# Patient Record
Sex: Female | Born: 1955 | Race: White | Hispanic: No | Marital: Married | Smoking: Never smoker
Health system: Southern US, Community
[De-identification: ages and names within clinical notes are randomized; demographics above are authoritative.]

## PROBLEM LIST (undated history)

## (undated) DIAGNOSIS — K219 Gastro-esophageal reflux disease without esophagitis: Secondary | ICD-10-CM

## (undated) DIAGNOSIS — M5030 Other cervical disc degeneration, unspecified cervical region: Secondary | ICD-10-CM

---

## 2001-11-15 ENCOUNTER — Ambulatory Visit (HOSPITAL_COMMUNITY): Admission: RE | Admit: 2001-11-15 | Discharge: 2001-11-15 | Payer: Self-pay | Admitting: Internal Medicine

## 2001-11-15 ENCOUNTER — Encounter: Payer: Self-pay | Admitting: Internal Medicine

## 2002-12-10 ENCOUNTER — Ambulatory Visit (HOSPITAL_COMMUNITY): Admission: RE | Admit: 2002-12-10 | Discharge: 2002-12-10 | Payer: Self-pay | Admitting: Internal Medicine

## 2002-12-10 ENCOUNTER — Encounter: Payer: Self-pay | Admitting: Internal Medicine

## 2003-02-28 ENCOUNTER — Other Ambulatory Visit: Admission: RE | Admit: 2003-02-28 | Discharge: 2003-02-28 | Payer: Self-pay | Admitting: Internal Medicine

## 2003-12-15 ENCOUNTER — Ambulatory Visit (HOSPITAL_COMMUNITY): Admission: RE | Admit: 2003-12-15 | Discharge: 2003-12-15 | Payer: Self-pay | Admitting: Family Medicine

## 2004-05-26 ENCOUNTER — Other Ambulatory Visit: Admission: RE | Admit: 2004-05-26 | Discharge: 2004-05-26 | Payer: Self-pay | Admitting: Family Medicine

## 2005-01-10 ENCOUNTER — Ambulatory Visit (HOSPITAL_COMMUNITY): Admission: RE | Admit: 2005-01-10 | Discharge: 2005-01-10 | Payer: Self-pay | Admitting: Internal Medicine

## 2005-07-22 ENCOUNTER — Other Ambulatory Visit: Admission: RE | Admit: 2005-07-22 | Discharge: 2005-07-22 | Payer: Self-pay | Admitting: Internal Medicine

## 2006-01-11 ENCOUNTER — Ambulatory Visit (HOSPITAL_COMMUNITY): Admission: RE | Admit: 2006-01-11 | Discharge: 2006-01-11 | Payer: Self-pay | Admitting: Internal Medicine

## 2006-01-27 ENCOUNTER — Encounter: Admission: RE | Admit: 2006-01-27 | Discharge: 2006-01-27 | Payer: Self-pay | Admitting: Internal Medicine

## 2006-02-13 ENCOUNTER — Encounter: Admission: RE | Admit: 2006-02-13 | Discharge: 2006-02-13 | Payer: Self-pay | Admitting: Internal Medicine

## 2006-07-14 ENCOUNTER — Encounter: Admission: RE | Admit: 2006-07-14 | Discharge: 2006-07-14 | Payer: Self-pay | Admitting: Family Medicine

## 2006-10-13 ENCOUNTER — Other Ambulatory Visit: Admission: RE | Admit: 2006-10-13 | Discharge: 2006-10-13 | Payer: Self-pay | Admitting: Family Medicine

## 2007-03-21 ENCOUNTER — Encounter: Admission: RE | Admit: 2007-03-21 | Discharge: 2007-03-21 | Payer: Self-pay | Admitting: Family Medicine

## 2007-12-06 HISTORY — PX: GALLBLADDER SURGERY: SHX652

## 2008-02-15 ENCOUNTER — Other Ambulatory Visit: Admission: RE | Admit: 2008-02-15 | Discharge: 2008-02-15 | Payer: Self-pay | Admitting: Family Medicine

## 2008-03-21 ENCOUNTER — Ambulatory Visit (HOSPITAL_COMMUNITY): Admission: RE | Admit: 2008-03-21 | Discharge: 2008-03-21 | Payer: Self-pay | Admitting: Family Medicine

## 2009-02-18 ENCOUNTER — Other Ambulatory Visit: Admission: RE | Admit: 2009-02-18 | Discharge: 2009-02-18 | Payer: Self-pay | Admitting: Family Medicine

## 2009-03-26 ENCOUNTER — Ambulatory Visit (HOSPITAL_COMMUNITY): Admission: RE | Admit: 2009-03-26 | Discharge: 2009-03-26 | Payer: Self-pay | Admitting: Family Medicine

## 2010-03-29 ENCOUNTER — Ambulatory Visit (HOSPITAL_COMMUNITY): Admission: RE | Admit: 2010-03-29 | Discharge: 2010-03-29 | Payer: Self-pay | Admitting: Family Medicine

## 2010-12-26 ENCOUNTER — Encounter: Payer: Self-pay | Admitting: Internal Medicine

## 2011-03-11 ENCOUNTER — Other Ambulatory Visit (HOSPITAL_COMMUNITY): Payer: Self-pay | Admitting: Family Medicine

## 2011-03-11 DIAGNOSIS — Z1231 Encounter for screening mammogram for malignant neoplasm of breast: Secondary | ICD-10-CM

## 2011-03-25 ENCOUNTER — Other Ambulatory Visit: Payer: Self-pay | Admitting: Family Medicine

## 2011-03-25 DIAGNOSIS — G453 Amaurosis fugax: Secondary | ICD-10-CM

## 2011-03-31 ENCOUNTER — Ambulatory Visit (HOSPITAL_COMMUNITY)
Admission: RE | Admit: 2011-03-31 | Discharge: 2011-03-31 | Disposition: A | Discharge status: Home / Self Care | Payer: 59 | Source: Ambulatory Visit | Attending: Family Medicine | Admitting: Family Medicine

## 2011-03-31 DIAGNOSIS — Z1231 Encounter for screening mammogram for malignant neoplasm of breast: Secondary | ICD-10-CM | POA: Insufficient documentation

## 2011-04-01 ENCOUNTER — Ambulatory Visit
Admission: RE | Admit: 2011-04-01 | Discharge: 2011-04-01 | Disposition: A | Discharge status: Home / Self Care | Payer: 59 | Source: Ambulatory Visit | Attending: Family Medicine | Admitting: Family Medicine

## 2011-04-01 DIAGNOSIS — G453 Amaurosis fugax: Secondary | ICD-10-CM

## 2012-04-04 ENCOUNTER — Other Ambulatory Visit (HOSPITAL_COMMUNITY): Payer: Self-pay | Admitting: Family Medicine

## 2012-04-04 DIAGNOSIS — Z1231 Encounter for screening mammogram for malignant neoplasm of breast: Secondary | ICD-10-CM

## 2012-04-05 ENCOUNTER — Ambulatory Visit (HOSPITAL_COMMUNITY)
Admission: RE | Admit: 2012-04-05 | Discharge: 2012-04-05 | Disposition: A | Discharge status: Home / Self Care | Payer: 59 | Source: Ambulatory Visit | Attending: Family Medicine | Admitting: Family Medicine

## 2012-04-05 DIAGNOSIS — Z1231 Encounter for screening mammogram for malignant neoplasm of breast: Secondary | ICD-10-CM | POA: Insufficient documentation

## 2012-05-23 ENCOUNTER — Other Ambulatory Visit (HOSPITAL_COMMUNITY)
Admission: RE | Admit: 2012-05-23 | Discharge: 2012-05-23 | Disposition: A | Discharge status: Home / Self Care | Payer: 59 | Source: Ambulatory Visit | Attending: Family Medicine | Admitting: Family Medicine

## 2012-05-23 ENCOUNTER — Other Ambulatory Visit: Payer: Self-pay | Admitting: Family Medicine

## 2012-05-23 DIAGNOSIS — Z124 Encounter for screening for malignant neoplasm of cervix: Secondary | ICD-10-CM | POA: Insufficient documentation

## 2013-03-07 ENCOUNTER — Other Ambulatory Visit (HOSPITAL_COMMUNITY): Payer: Self-pay | Admitting: Family Medicine

## 2013-03-07 DIAGNOSIS — Z1231 Encounter for screening mammogram for malignant neoplasm of breast: Secondary | ICD-10-CM

## 2013-04-22 ENCOUNTER — Ambulatory Visit (HOSPITAL_COMMUNITY)
Admission: RE | Admit: 2013-04-22 | Discharge: 2013-04-22 | Disposition: A | Discharge status: Home / Self Care | Payer: 59 | Source: Ambulatory Visit | Attending: Family Medicine | Admitting: Family Medicine

## 2013-04-22 DIAGNOSIS — Z1231 Encounter for screening mammogram for malignant neoplasm of breast: Secondary | ICD-10-CM | POA: Insufficient documentation

## 2014-03-24 ENCOUNTER — Other Ambulatory Visit (HOSPITAL_COMMUNITY): Payer: Self-pay | Admitting: Family Medicine

## 2014-03-26 ENCOUNTER — Other Ambulatory Visit: Payer: Self-pay | Admitting: Family Medicine

## 2014-03-26 DIAGNOSIS — N644 Mastodynia: Secondary | ICD-10-CM

## 2014-04-02 ENCOUNTER — Ambulatory Visit
Admission: RE | Admit: 2014-04-02 | Discharge: 2014-04-02 | Disposition: A | Discharge status: Home / Self Care | Payer: 59 | Source: Ambulatory Visit | Attending: Family Medicine | Admitting: Family Medicine

## 2014-04-02 ENCOUNTER — Encounter (INDEPENDENT_AMBULATORY_CARE_PROVIDER_SITE_OTHER): Payer: Self-pay

## 2014-04-02 DIAGNOSIS — N644 Mastodynia: Secondary | ICD-10-CM

## 2014-10-10 ENCOUNTER — Ambulatory Visit: Payer: 59 | Attending: Family Medicine | Admitting: Physical Therapy

## 2014-10-10 DIAGNOSIS — M25672 Stiffness of left ankle, not elsewhere classified: Secondary | ICD-10-CM | POA: Diagnosis not present

## 2014-10-10 DIAGNOSIS — Z9889 Other specified postprocedural states: Secondary | ICD-10-CM | POA: Diagnosis not present

## 2014-10-10 DIAGNOSIS — M25572 Pain in left ankle and joints of left foot: Secondary | ICD-10-CM | POA: Insufficient documentation

## 2014-10-10 DIAGNOSIS — M7662 Achilles tendinitis, left leg: Secondary | ICD-10-CM | POA: Diagnosis not present

## 2014-10-10 DIAGNOSIS — Z5189 Encounter for other specified aftercare: Secondary | ICD-10-CM | POA: Insufficient documentation

## 2014-10-13 ENCOUNTER — Ambulatory Visit: Payer: 59 | Admitting: Physical Therapy

## 2014-10-13 DIAGNOSIS — Z5189 Encounter for other specified aftercare: Secondary | ICD-10-CM | POA: Diagnosis not present

## 2014-10-17 ENCOUNTER — Ambulatory Visit: Payer: 59 | Admitting: Physical Therapy

## 2014-11-05 ENCOUNTER — Ambulatory Visit: Payer: 59 | Attending: Family Medicine | Admitting: Physical Therapy

## 2014-11-05 DIAGNOSIS — Z5189 Encounter for other specified aftercare: Secondary | ICD-10-CM | POA: Diagnosis present

## 2014-11-05 DIAGNOSIS — M25572 Pain in left ankle and joints of left foot: Secondary | ICD-10-CM | POA: Insufficient documentation

## 2014-11-05 DIAGNOSIS — M7662 Achilles tendinitis, left leg: Secondary | ICD-10-CM | POA: Diagnosis not present

## 2014-11-05 DIAGNOSIS — M25672 Stiffness of left ankle, not elsewhere classified: Secondary | ICD-10-CM | POA: Diagnosis not present

## 2014-11-05 DIAGNOSIS — Z9889 Other specified postprocedural states: Secondary | ICD-10-CM | POA: Insufficient documentation

## 2014-11-07 ENCOUNTER — Ambulatory Visit: Payer: 59 | Admitting: Physical Therapy

## 2014-11-07 DIAGNOSIS — Z5189 Encounter for other specified aftercare: Secondary | ICD-10-CM | POA: Diagnosis not present

## 2014-11-11 ENCOUNTER — Ambulatory Visit: Payer: 59 | Admitting: Physical Therapy

## 2014-11-11 DIAGNOSIS — Z5189 Encounter for other specified aftercare: Secondary | ICD-10-CM | POA: Diagnosis not present

## 2014-11-13 ENCOUNTER — Ambulatory Visit: Payer: 59 | Admitting: Physical Therapy

## 2014-11-13 DIAGNOSIS — Z5189 Encounter for other specified aftercare: Secondary | ICD-10-CM | POA: Diagnosis not present

## 2014-11-18 ENCOUNTER — Ambulatory Visit: Payer: 59 | Admitting: Physical Therapy

## 2014-11-18 DIAGNOSIS — Z5189 Encounter for other specified aftercare: Secondary | ICD-10-CM | POA: Diagnosis not present

## 2014-11-21 ENCOUNTER — Ambulatory Visit: Payer: 59 | Admitting: Physical Therapy

## 2014-11-24 ENCOUNTER — Ambulatory Visit: Payer: 59

## 2014-11-24 DIAGNOSIS — Z5189 Encounter for other specified aftercare: Secondary | ICD-10-CM | POA: Diagnosis not present

## 2014-11-26 ENCOUNTER — Ambulatory Visit: Payer: 59

## 2014-12-01 ENCOUNTER — Encounter: Payer: 59 | Admitting: Physical Therapy

## 2014-12-03 ENCOUNTER — Encounter: Payer: 59 | Admitting: Physical Therapy

## 2014-12-13 ENCOUNTER — Emergency Department (HOSPITAL_COMMUNITY)
Admission: EM | Admit: 2014-12-13 | Discharge: 2014-12-14 | Disposition: A | Discharge status: Home / Self Care | Payer: 59 | Attending: Emergency Medicine | Admitting: Emergency Medicine

## 2014-12-13 ENCOUNTER — Encounter (HOSPITAL_COMMUNITY): Payer: Self-pay | Admitting: Emergency Medicine

## 2014-12-13 DIAGNOSIS — Z79899 Other long term (current) drug therapy: Secondary | ICD-10-CM | POA: Diagnosis not present

## 2014-12-13 DIAGNOSIS — K922 Gastrointestinal hemorrhage, unspecified: Secondary | ICD-10-CM | POA: Insufficient documentation

## 2014-12-13 DIAGNOSIS — R1032 Left lower quadrant pain: Secondary | ICD-10-CM

## 2014-12-13 DIAGNOSIS — R630 Anorexia: Secondary | ICD-10-CM | POA: Insufficient documentation

## 2014-12-13 DIAGNOSIS — D72829 Elevated white blood cell count, unspecified: Secondary | ICD-10-CM | POA: Diagnosis not present

## 2014-12-13 DIAGNOSIS — K219 Gastro-esophageal reflux disease without esophagitis: Secondary | ICD-10-CM | POA: Insufficient documentation

## 2014-12-13 DIAGNOSIS — R109 Unspecified abdominal pain: Secondary | ICD-10-CM | POA: Diagnosis present

## 2014-12-13 DIAGNOSIS — K529 Noninfective gastroenteritis and colitis, unspecified: Secondary | ICD-10-CM

## 2014-12-13 HISTORY — DX: Gastro-esophageal reflux disease without esophagitis: K21.9

## 2014-12-13 HISTORY — DX: Other cervical disc degeneration, unspecified cervical region: M50.30

## 2014-12-13 LAB — COMPREHENSIVE METABOLIC PANEL
ALT: 54 U/L — ABNORMAL HIGH (ref 0–35)
AST: 53 U/L — ABNORMAL HIGH (ref 0–37)
Albumin: 4.1 g/dL (ref 3.5–5.2)
Alkaline Phosphatase: 69 U/L (ref 39–117)
Anion gap: 8 (ref 5–15)
BILIRUBIN TOTAL: 0.8 mg/dL (ref 0.3–1.2)
BUN: 8 mg/dL (ref 6–23)
CALCIUM: 8.8 mg/dL (ref 8.4–10.5)
CHLORIDE: 95 meq/L — AB (ref 96–112)
CO2: 26 mmol/L (ref 19–32)
Creatinine, Ser: 0.68 mg/dL (ref 0.50–1.10)
GFR calc Af Amer: >90 mL/min (ref >90)
GFR calc non Af Amer: >90 mL/min (ref >90)
GLUCOSE: 147 mg/dL — AB (ref 70–99)
POTASSIUM: 3.8 mmol/L (ref 3.5–5.1)
Sodium: 129 mmol/L — ABNORMAL LOW (ref 135–145)
Total Protein: 7.1 g/dL (ref 6.0–8.3)

## 2014-12-13 LAB — CBC
HCT: 37.7 % (ref 36.0–46.0)
Hemoglobin: 12.9 g/dL (ref 12.0–15.0)
MCH: 31.2 pg (ref 26.0–34.0)
MCHC: 34.2 g/dL (ref 30.0–36.0)
MCV: 91.3 fL (ref 78.0–100.0)
PLATELETS: 249 10*3/uL (ref 150–400)
RBC: 4.13 MIL/uL (ref 3.87–5.11)
RDW: 11.9 % (ref 11.5–15.5)
WBC: 11.8 10*3/uL — AB (ref 4.0–10.5)

## 2014-12-13 LAB — TYPE AND SCREEN
ABO/RH(D): A POS
Antibody Screen: NEGATIVE

## 2014-12-13 LAB — ABO/RH: ABO/RH(D): A POS

## 2014-12-13 MED ORDER — SODIUM CHLORIDE 0.9 % IV BOLUS (SEPSIS)
1000.0000 mL | Freq: Once | INTRAVENOUS | Status: AC
  Administered 2014-12-13: 1000 mL via INTRAVENOUS

## 2014-12-13 MED ORDER — ONDANSETRON 8 MG PO TBDP: 8.0000 mg | ORAL_TABLET | Freq: Three times a day (TID) | ORAL | Status: DC | PRN

## 2014-12-13 MED ORDER — ONDANSETRON HCL 4 MG/2ML IJ SOLN
4.0000 mg | Freq: Once | INTRAMUSCULAR | Status: AC
  Administered 2014-12-13: 4 mg via INTRAVENOUS
  Filled 2014-12-13: qty 2

## 2014-12-13 MED ORDER — DICYCLOMINE HCL 10 MG PO CAPS
10.0000 mg | ORAL_CAPSULE | Freq: Once | ORAL | Status: AC
  Administered 2014-12-13: 10 mg via ORAL
  Filled 2014-12-13: qty 1

## 2014-12-13 MED ORDER — CIPROFLOXACIN HCL 500 MG PO TABS: 500.0000 mg | ORAL_TABLET | Freq: Two times a day (BID) | ORAL | Status: DC

## 2014-12-13 MED ORDER — DICYCLOMINE HCL 20 MG PO TABS: 20.0000 mg | ORAL_TABLET | Freq: Two times a day (BID) | ORAL | Status: DC | PRN

## 2014-12-13 NOTE — ED Provider Notes (Signed)
CSN: [id number]     Arrival date & time 12/13/14  1938 History   First MD Initiated Contact with Patient 12/13/14 2110     Chief Complaint  Patient presents with  . Abdominal Pain  . Rectal Bleeding     (Consider location/radiation/quality/duration/timing/severity/associated sxs/prior Treatment) Patient is a 59 y.o. female presenting with abdominal pain and hematochezia. The history is provided by the patient.  Abdominal Pain Associated symptoms: diarrhea, hematochezia, nausea and vomiting   Associated symptoms: no chest pain and no shortness of breath   Rectal Bleeding Associated symptoms: abdominal pain and vomiting    patient with nausea vomiting diarrhea. Started out normal this morning and then developed some blood in the diarrhea. Some chills. Some abdominal pain. Decreased appetite. No blood in the emesis. No other bleeding.  Past Medical History  Diagnosis Date  . GERD (gastroesophageal reflux disease)   . Degenerative disc disease, cervical    History reviewed. No pertinent past surgical history. History reviewed. No pertinent family history. History  Substance Use Topics  . Smoking status: Never Smoker   . Smokeless tobacco: Not on file  . Alcohol Use: No   OB History    No data available     Review of Systems  Constitutional: Negative for activity change and appetite change.  Eyes: Negative for pain.  Respiratory: Negative for chest tightness and shortness of breath.   Cardiovascular: Negative for chest pain and leg swelling.  Gastrointestinal: Positive for nausea, vomiting, abdominal pain, diarrhea, blood in stool and hematochezia.  Genitourinary: Negative for flank pain.  Musculoskeletal: Negative for back pain and neck stiffness.  Skin: Negative for rash.  Neurological: Negative for weakness, numbness and headaches.  Psychiatric/Behavioral: Negative for behavioral problems.      Allergies  Sulfa antibiotics and Codeine  Home Medications   Prior to  Admission medications   Medication Sig Start Date End Date Taking? Authorizing Provider  buPROPion (WELLBUTRIN XL) 150 MG 24 hr tablet Take 300 mg by mouth daily.  11/07/14  Yes Historical Provider, MD  diphenhydrAMINE (SOMINEX) 25 MG tablet Take 25 mg by mouth at bedtime.   Yes Historical Provider, MD  gabapentin (NEURONTIN) 300 MG capsule Take 300 mg by mouth 2 (two) times daily. 11/14/14  Yes Historical Provider, MD  Melatonin 10 MG TBCR Take 10 mg by mouth at bedtime.   Yes Historical Provider, MD  Multiple Vitamin (MULTIVITAMIN WITH MINERALS) TABS tablet Take 1 tablet by mouth daily.   Yes Historical Provider, MD  nitroGLYCERIN (NITRODUR - DOSED IN MG/24 HR) 0.2 mg/hr patch Place 1 patch onto the skin daily. 12/04/14  Yes Historical Provider, MD  omeprazole (PRILOSEC) 40 MG capsule Take 40 mg by mouth daily. 11/22/14  Yes Historical Provider, MD  zolpidem (AMBIEN) 5 MG tablet Take 2.5 mg by mouth at bedtime. 11/07/14  Yes Historical Provider, MD  ciprofloxacin (CIPRO) 500 MG tablet Take 1 tablet (500 mg total) by mouth 2 (two) times daily. 12/13/14   Juliet RudeNathan R. Skylan Lara, MD  dicyclomine (BENTYL) 20 MG tablet Take 1 tablet (20 mg total) by mouth 2 (two) times daily as needed for spasms. 12/13/14   Juliet RudeNathan R. Jedediah Noda, MD  ondansetron (ZOFRAN-ODT) 8 MG disintegrating tablet Take 1 tablet (8 mg total) by mouth every 8 (eight) hours as needed for nausea or vomiting. 12/13/14   Juliet RudeNathan R. Lashawna Poche, MD   BP 125/59 mmHg  Pulse 97  Temp(Src) 98.5 F (36.9 C) (Oral)  Resp 18  Ht 5' 8.5" (1.74 m)  Wt 150 lb (68.04 kg)  BMI 22.47 kg/m2  SpO2 99% Physical Exam  Constitutional: She is oriented to person, place, and time. She appears well-developed and well-nourished.  HENT:  Head: Normocephalic and atraumatic.  Eyes: EOM are normal. Pupils are equal, round, and reactive to light.  Neck: Normal range of motion. Neck supple.  Cardiovascular: Normal rate, regular rhythm and normal heart sounds.   No  murmur heard. Pulmonary/Chest: Effort normal and breath sounds normal. No respiratory distress. She has no wheezes. She has no rales.  Abdominal: Soft. She exhibits no distension. There is tenderness. There is no rebound and no guarding.  Lower abdominal tenderness without rebound or guarding.  Musculoskeletal: Normal range of motion.  Neurological: She is alert and oriented to person, place, and time. No cranial nerve deficit.  Skin: Skin is warm and dry.  Psychiatric: She has a normal mood and affect. Her speech is normal.  Nursing note and vitals reviewed.   ED Course  Procedures (including critical care time) Labs Review Labs Reviewed  CBC - Abnormal; Notable for the following:    WBC 11.8 (*)    All other components within normal limits  COMPREHENSIVE METABOLIC PANEL - Abnormal; Notable for the following:    Sodium 129 (*)    Chloride 95 (*)    Glucose, Bld 147 (*)    AST 53 (*)    ALT 54 (*)    All other components within normal limits  TYPE AND SCREEN  ABO/RH    Imaging Review No results found.   EKG Interpretation None      MDM   Final diagnoses:  Bloody diarrhea    Patient with bloody diarrhea. Also some nausea and vomiting. Lab work overall reassuring. White count mildly elevated. Normal hemoglobin. Will treat with antibiotics and have follow-up as needed.    Juliet Rude. Rubin Payor, MD 12/13/14 2329

## 2014-12-13 NOTE — ED Notes (Signed)
Patient here with complaint of abdominal pain, diarrhea, nausea, vomiting, and bloody diarrhea. Diarrhea appears bright red. Patient denies blood in emesis. Additionally states she feel weak. Denies fevers.

## 2014-12-13 NOTE — ED Notes (Signed)
Pt placed in a gown and hooked up to the monitor with 5 lead, BP cuff and pulse ox 

## 2014-12-14 ENCOUNTER — Emergency Department (HOSPITAL_COMMUNITY): Payer: 59

## 2014-12-14 ENCOUNTER — Encounter (HOSPITAL_COMMUNITY): Payer: Self-pay | Admitting: Radiology

## 2014-12-14 MED ORDER — IOHEXOL 300 MG/ML  SOLN
100.0000 mL | Freq: Once | INTRAMUSCULAR | Status: AC | PRN
  Administered 2014-12-14: 100 mL via INTRAVENOUS

## 2014-12-14 MED ORDER — METRONIDAZOLE 500 MG PO TABS: 500.0000 mg | ORAL_TABLET | Freq: Two times a day (BID) | ORAL | Status: AC

## 2014-12-14 MED ORDER — IOHEXOL 300 MG/ML  SOLN
25.0000 mL | Freq: Once | INTRAMUSCULAR | Status: AC | PRN
  Administered 2014-12-14: 25 mL via ORAL

## 2014-12-14 MED ORDER — ONDANSETRON 8 MG PO TBDP: 8.0000 mg | ORAL_TABLET | Freq: Three times a day (TID) | ORAL | Status: AC | PRN

## 2014-12-14 MED ORDER — OXYCODONE-ACETAMINOPHEN 5-325 MG PO TABS: 1.0000 | ORAL_TABLET | ORAL | Status: AC | PRN

## 2014-12-14 MED ORDER — CIPROFLOXACIN HCL 500 MG PO TABS: 500.0000 mg | ORAL_TABLET | Freq: Two times a day (BID) | ORAL | Status: AC

## 2014-12-14 MED ORDER — MORPHINE SULFATE 4 MG/ML IJ SOLN
4.0000 mg | Freq: Once | INTRAMUSCULAR | Status: AC
  Administered 2014-12-14: 4 mg via INTRAVENOUS
  Filled 2014-12-14: qty 1

## 2014-12-14 MED ORDER — CIPROFLOXACIN IN D5W 400 MG/200ML IV SOLN
400.0000 mg | Freq: Once | INTRAVENOUS | Status: AC
  Administered 2014-12-14: 400 mg via INTRAVENOUS
  Filled 2014-12-14: qty 200

## 2014-12-14 MED ORDER — METRONIDAZOLE IN NACL 5-0.79 MG/ML-% IV SOLN
500.0000 mg | Freq: Once | INTRAVENOUS | Status: AC
  Administered 2014-12-14: 500 mg via INTRAVENOUS
  Filled 2014-12-14: qty 100

## 2014-12-14 NOTE — Discharge Instructions (Signed)

## 2014-12-14 NOTE — ED Provider Notes (Signed)
3:02 AM Patient is feeling much better at this time.  CT scan demonstrates colitis without diverticular disease or diverticulitis.  She's had no more bloody bowel movements here in the emergency department.  I suspect this is an inflammatory colitis.  Patient was given IV Cipro and Flagyl emergency department.  Home with Cipro and Flagyl.  She understands to return to the ER for new or worsening symptoms.  She understands return for worsening bleeding.  Vital signs have normalized.  Ct Abdomen Pelvis W Contrast  12/14/2014   CLINICAL DATA:  Abdominal pain, diarrhea, nausea, vomiting, bloody diarrhea. Weakness.  EXAM: CT ABDOMEN AND PELVIS WITH CONTRAST  TECHNIQUE: Multidetector CT imaging of the abdomen and pelvis was performed using the standard protocol following bolus administration of intravenous contrast.  CONTRAST:  100mL OMNIPAQUE IOHEXOL 300 MG/ML  SOLN  COMPARISON:  None.  FINDINGS: Mild dependent changes in the lung bases.  Surgical absence of the gallbladder. Mild intrahepatic bile duct dilatation is likely normal for postoperative state. The pancreas, spleen, adrenal glands, abdominal aorta, inferior vena cava, and retroperitoneal lymph nodes are unremarkable. Prominent renal pelvises bilaterally without changes of obstruction. Bladder is dilated without wall thickening. Stomach and proximal small bowel appear normal. There is suggestion of wall thickening and some of the distal ileum. Wall thickening is also demonstrated in the descending and sigmoid colon with pericolonic stranding. Changes may represent enterocolitis or inflammatory bowel disease. Consider possible Crohn's disease. No evidence of bowel obstruction. No free air or free fluid in the abdomen.  Pelvis: Uterus and ovaries are not enlarged. Bladder wall is not thickened. Small amount of free fluid in the pericolic gutters is likely inflammatory reactive fluid. No pelvic mass or lymphadenopathy. Normal alignment of the lumbar spine. No  destructive bone lesions.  IMPRESSION: Segmental wall thickening is demonstrated in the distal small bowel and in the descending and sigmoid colon with pericolonic stranding and small amount of free fluid in the pelvis. Changes likely to represent enterocolitis, possible infectious or inflammatory. Consider Crohn's disease.   Electronically Signed   By: Burman NievesWilliam  Stevens M.D.   On: 12/14/2014 01:36   I personally reviewed the imaging tests through PACS system I reviewed available ER/hospitalization records through the EMR   Lyanne CoKevin M Manilla Strieter, MD 12/14/14 [id number]

## 2014-12-14 NOTE — ED Provider Notes (Signed)
12:14 AM Care from Dr Rubin PayorPickering. Concern during my examination for acute diverticular bleed. LLQ tenderness as well. Will obtain CT abd/pelvis and consider observation admission for lower GI bleed. Pt describes bright red blood per rectum with clots x 6 tonight without a known hx of diverticulosis.   Lyanne CoKevin M Kiwana Deblasi, MD 12/14/14 [phone]

## 2015-02-13 ENCOUNTER — Other Ambulatory Visit: Payer: Self-pay | Admitting: Family Medicine

## 2015-02-13 ENCOUNTER — Other Ambulatory Visit (HOSPITAL_COMMUNITY)
Admission: RE | Admit: 2015-02-13 | Discharge: 2015-02-13 | Disposition: A | Discharge status: Home / Self Care | Payer: 59 | Source: Ambulatory Visit | Attending: Family Medicine | Admitting: Family Medicine

## 2015-02-13 DIAGNOSIS — Z124 Encounter for screening for malignant neoplasm of cervix: Secondary | ICD-10-CM | POA: Insufficient documentation

## 2015-02-13 DIAGNOSIS — Z1151 Encounter for screening for human papillomavirus (HPV): Secondary | ICD-10-CM | POA: Insufficient documentation

## 2015-02-16 LAB — CYTOLOGY - PAP

## 2015-03-09 ENCOUNTER — Other Ambulatory Visit (HOSPITAL_COMMUNITY): Payer: Self-pay | Admitting: Family Medicine

## 2015-03-09 DIAGNOSIS — Z1231 Encounter for screening mammogram for malignant neoplasm of breast: Secondary | ICD-10-CM

## 2015-04-06 ENCOUNTER — Ambulatory Visit (HOSPITAL_COMMUNITY)
Admission: RE | Admit: 2015-04-06 | Discharge: 2015-04-06 | Disposition: A | Discharge status: Home / Self Care | Payer: 59 | Source: Ambulatory Visit | Attending: Family Medicine | Admitting: Family Medicine

## 2015-04-06 DIAGNOSIS — Z1231 Encounter for screening mammogram for malignant neoplasm of breast: Secondary | ICD-10-CM | POA: Insufficient documentation

## 2015-04-13 ENCOUNTER — Other Ambulatory Visit: Payer: Self-pay | Admitting: Family Medicine

## 2015-04-13 DIAGNOSIS — N644 Mastodynia: Secondary | ICD-10-CM

## 2015-04-21 ENCOUNTER — Ambulatory Visit
Admission: RE | Admit: 2015-04-21 | Discharge: 2015-04-21 | Disposition: A | Discharge status: Home / Self Care | Payer: 59 | Source: Ambulatory Visit | Attending: Family Medicine | Admitting: Family Medicine

## 2015-04-21 DIAGNOSIS — N644 Mastodynia: Secondary | ICD-10-CM

## 2015-09-07 IMAGING — CT CT ABD-PELV W/ CM
2 of 5 series · 12 of 46 positions shown, 14 images · IV contrast (Iodine)
Comparison: None.

CLINICAL DATA: Abdominal pain, diarrhea, nausea, vomiting, bloody
diarrhea. Weakness.

EXAM:
CT ABDOMEN AND PELVIS WITH CONTRAST
TECHNIQUE: Multidetector CT imaging of the abdomen and pelvis was performed
using the standard protocol following bolus administration of
intravenous contrast.
CONTRAST:  100mL OMNIPAQUE IOHEXOL 300 MG/ML  SOLN

[Series 201: routine, idose (2) · axial · 0.72mm/px · z∈[-395,-35]mm · 9 of 92 slices shown, 11 images]
[im 10/92  soft-tissue]
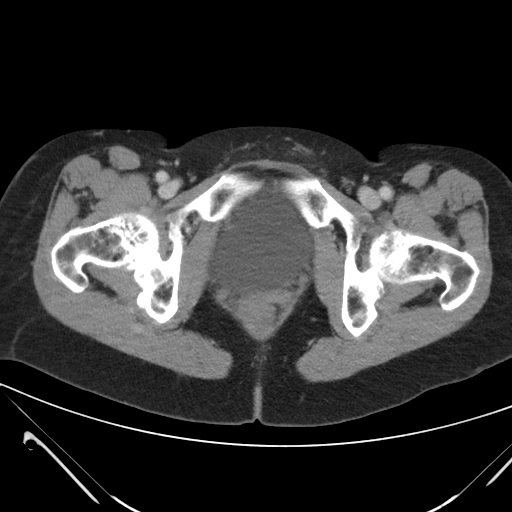
[im 10/92  bone]
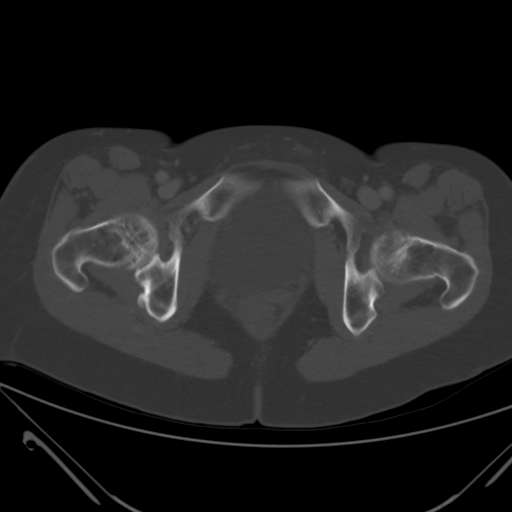
[im 19/92  soft-tissue]
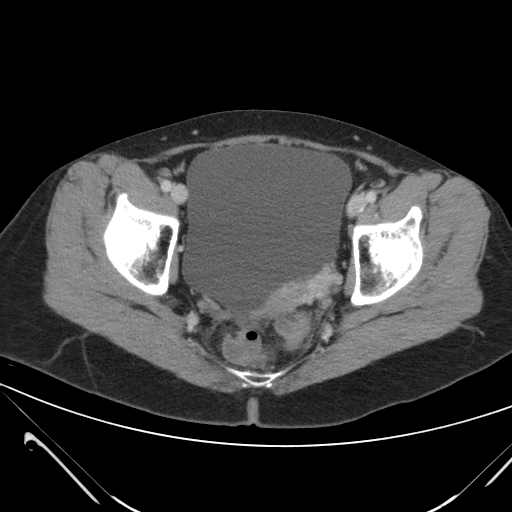
[im 28/92  soft-tissue]
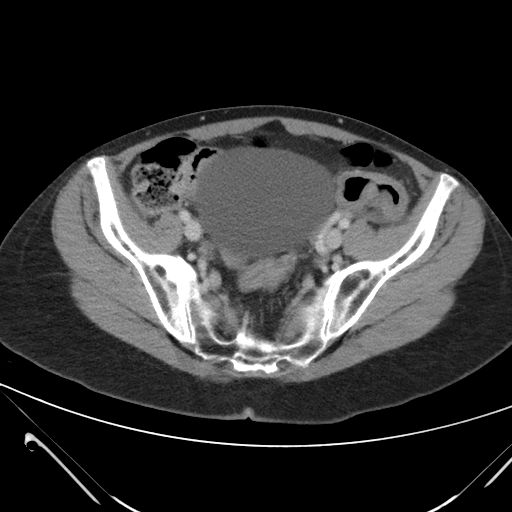
[im 37/92  soft-tissue]
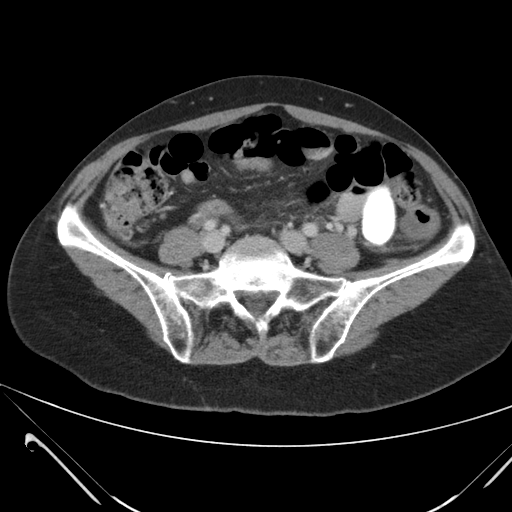
[im 46/92  soft-tissue]
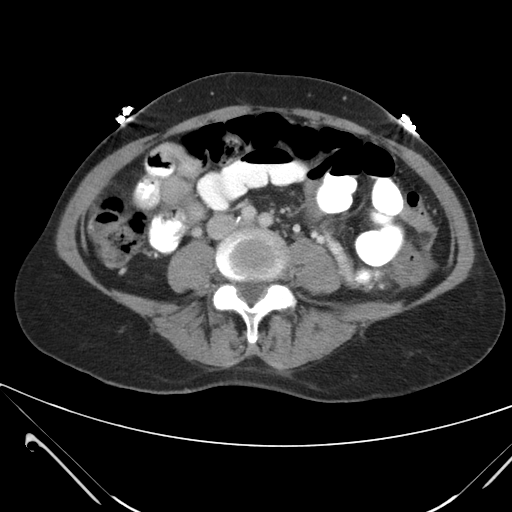
[im 55/92  soft-tissue]
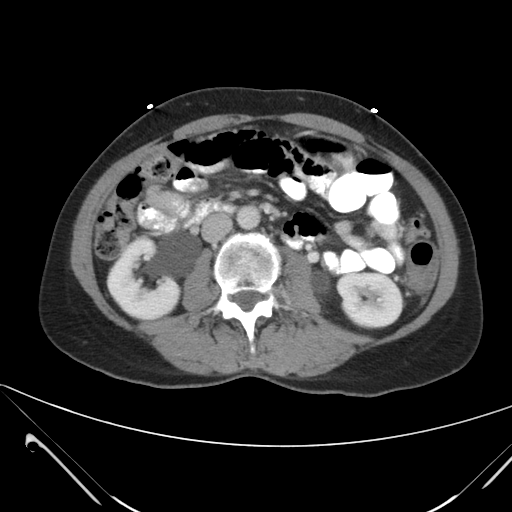
[im 64/92  soft-tissue]
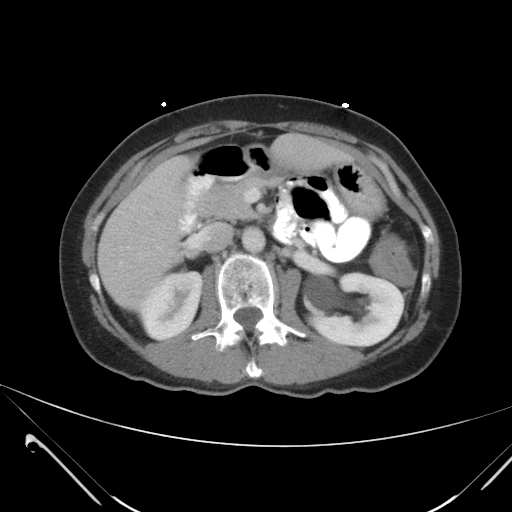
[im 73/92  soft-tissue]
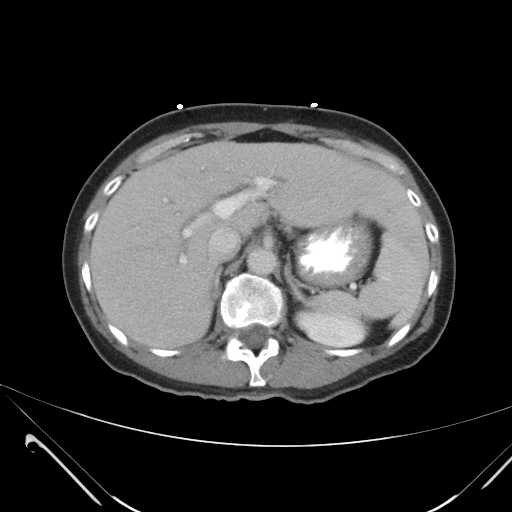
[im 82/92  soft-tissue]
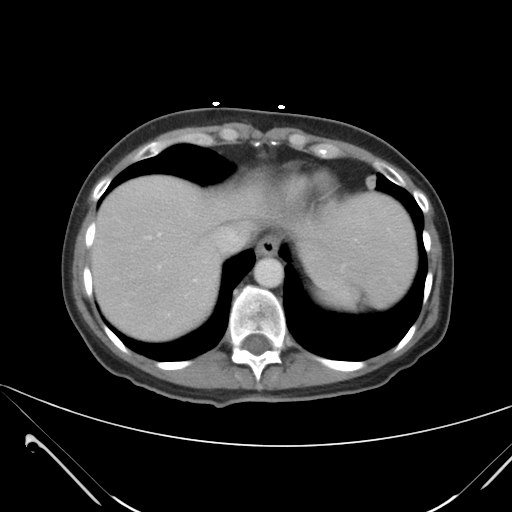
[im 82/92  bone]
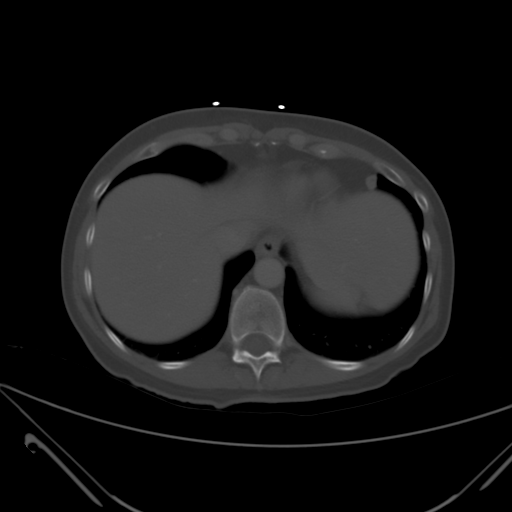

[Series 202: coronals, idose (2) · coronal · 0.50mm/px · 3 of 87 slices shown]
[im 29/87  soft-tissue]
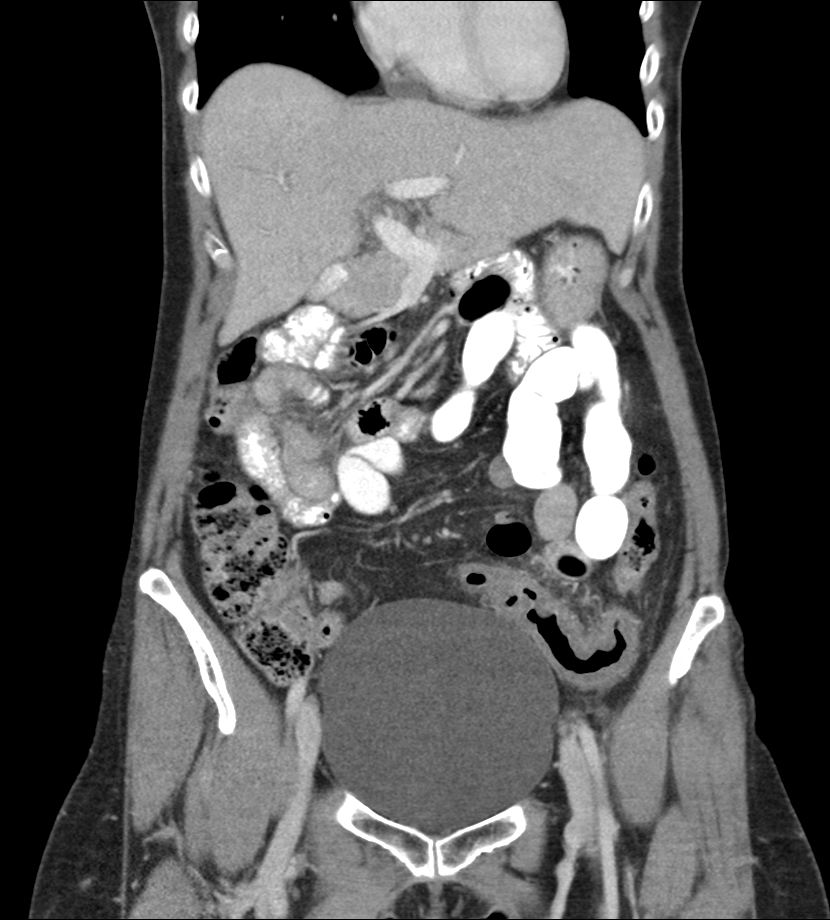
[im 39/87  soft-tissue]
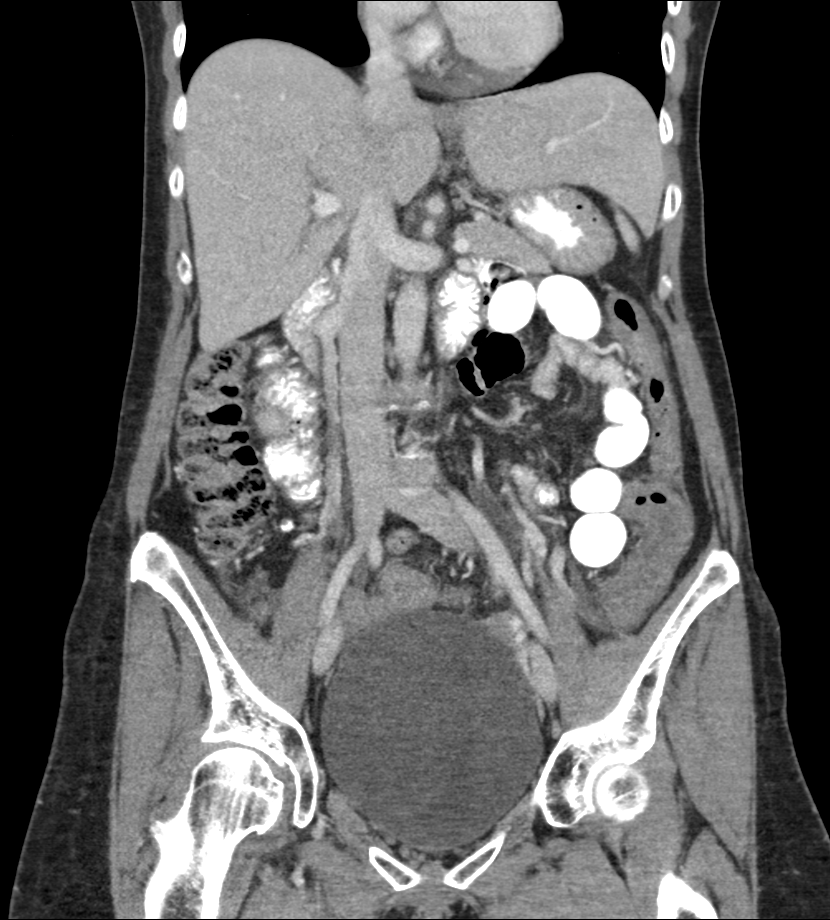
[im 48/87  soft-tissue]
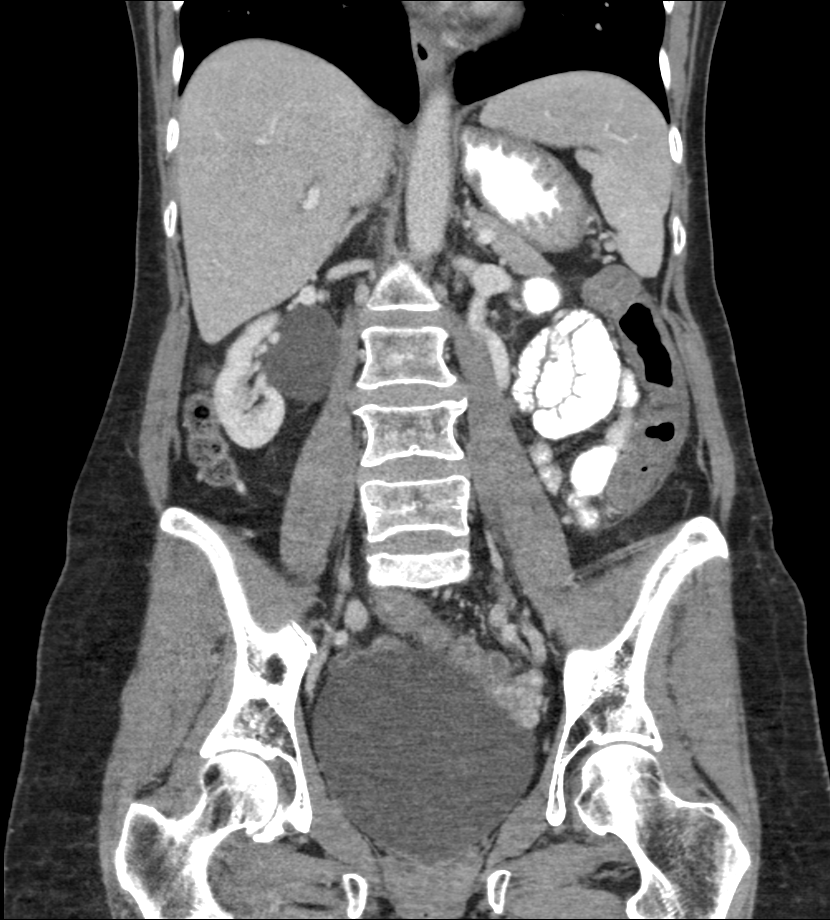

[12 of 46 positions shown; findings below may reference images not displayed]

FINDINGS: Mild dependent changes in the lung bases.

Surgical absence of the gallbladder. Mild intrahepatic bile duct
dilatation is likely normal for postoperative state. The pancreas,
spleen, adrenal glands, abdominal aorta, inferior vena cava, and
retroperitoneal lymph nodes are unremarkable. Prominent renal
pelvises bilaterally without changes of obstruction. Bladder is
dilated without wall thickening. Stomach and proximal small bowel
appear normal. There is suggestion of wall thickening and some of
the distal ileum. Wall thickening is also demonstrated in the
descending and sigmoid colon with pericolonic stranding. Changes may
represent enterocolitis or inflammatory bowel disease. Consider
possible Crohn's disease. No evidence of bowel obstruction. No free
air or free fluid in the abdomen.

Pelvis: Uterus and ovaries are not enlarged. Bladder wall is not
thickened. Small amount of free fluid in the pericolic gutters is
likely inflammatory reactive fluid. No pelvic mass or
lymphadenopathy. Normal alignment of the lumbar spine. No
destructive bone lesions.
IMPRESSION: Segmental wall thickening is demonstrated in the distal small bowel
and in the descending and sigmoid colon with pericolonic stranding
and small amount of free fluid in the pelvis. Changes likely to
represent enterocolitis, possible infectious or inflammatory.
Consider Crohn's disease.

## 2016-03-16 ENCOUNTER — Other Ambulatory Visit: Payer: Self-pay

## 2016-03-16 DIAGNOSIS — Z1231 Encounter for screening mammogram for malignant neoplasm of breast: Secondary | ICD-10-CM

## 2016-05-16 ENCOUNTER — Ambulatory Visit
Admission: RE | Admit: 2016-05-16 | Discharge: 2016-05-16 | Disposition: A | Discharge status: Home / Self Care | Payer: 59 | Source: Ambulatory Visit

## 2016-05-16 DIAGNOSIS — Z1231 Encounter for screening mammogram for malignant neoplasm of breast: Secondary | ICD-10-CM

## 2017-04-06 ENCOUNTER — Other Ambulatory Visit: Payer: Self-pay | Admitting: Family Medicine

## 2017-04-06 DIAGNOSIS — Z1231 Encounter for screening mammogram for malignant neoplasm of breast: Secondary | ICD-10-CM

## 2017-05-18 ENCOUNTER — Ambulatory Visit
Admission: RE | Admit: 2017-05-18 | Discharge: 2017-05-18 | Disposition: A | Discharge status: Home / Self Care | Payer: 59 | Source: Ambulatory Visit | Attending: Family Medicine | Admitting: Family Medicine

## 2017-05-18 DIAGNOSIS — Z1231 Encounter for screening mammogram for malignant neoplasm of breast: Secondary | ICD-10-CM

## 2017-07-21 ENCOUNTER — Ambulatory Visit: Payer: 59 | Attending: Family Medicine | Admitting: Physical Therapy

## 2017-07-21 ENCOUNTER — Encounter: Payer: Self-pay | Admitting: Physical Therapy

## 2017-07-21 DIAGNOSIS — M6281 Muscle weakness (generalized): Secondary | ICD-10-CM | POA: Diagnosis not present

## 2017-07-21 NOTE — Patient Instructions (Signed)
  Osteoporosis   What is Osteoporosis?  - A silent disease in which the skeleton is weakened by decreased bone density. - Characterized by low bone mass, deterioration of bone, and increased risk of fracture postmenopausal (primary) or the result of an identifiable condition/event (secondary) - Commonly found in the wrists, spine, and hips; these are high-risk stress areas and very susceptible to fractures.  The Facts: - There are 1.5 million fractures/year o 500,000 spine; 250,000 hip with over 60,000 nursing home admissions secondary to hip fracture; and 200,000 wrist - After hip fracture, only 50% of people able to walk independently prior to the fracture return to independent ambulation. - Bone mass: Peaks at age 20-30, and begins declining at age 40-50.   Osteoporosis is defined by the World Health Organization (WHO) as:  NOF/WHO Criteria for Interpreting Results of Bone Density Assessment  Results Diagnosis  Within 1 standard deviation (SD) of young adult mean Normal  Between 1 and -2.5 SD below mean, repeat in 2 years Low bone mass (osteopenia)  Greater than -2.5 SD below mean Osteoporosis  Greater than -2.5 SD below mean and one or more fragility fractures exist Severe Osteoporosis  *Results can be affected by positioning of the body in the DEXA scan, presence of current or old fractures, arthritis, extraneous calcifications.    Osteoporosis is not just a women's disease!  - 30-40% of women will develop osteoporosis - 5-15% of males will develop osteoporosis   What are the risk factors?  1. Female 2. Thin, small frame 3. Caucasian, Asian race 4. Early menopause (<45 years old)/amenorrhea/delayed puberty 5. Old age 6. Family history (fractures, stooped posture)\ 7. Low calcium diet 8. Sedentary lifestyle 9. Alcohol, Caffeine, Smoking 10. Malnutrition, GI Disease 11. Prolonged use of Glucocorticoids (Prednisone), Meds to treat asthma, arthritis, cancers,  thyroid, and anti-seizure meds.  How do I know for sure?  Get a BONE DENSITY TEST!  This measures bone loss and it's painless, non-invasive, and only takes 5-10 minutes!  What can I do about it?  ? Decrease your risk factors (alcohol, caffeine, smoking) ? Helpful medications (see next page) ? Adequate Calcium and Vitamin D intake ? Get active! o Proper posture - Sit and stand tall! No slouching or twisting o Weight-Bearing Exercise - walking, stair climbing, elliptical; NO jogging or high-impact exercise. o Resistive Exercise - Cybex weight equipment, Nautilus, dumbbells, therabands  **Be sure to maintain proper alignment when lifting any weight!!  **When using equipment, avoid abdominal exercises which involve "crunching" or curling or twisting the trunk, biceps machines, cross-country machines, moving handlebars, or ANY MACHINE WITH ROTATION OR FORWARD BENDING!!!           Approved Pharmacologic Management of Osteoporosis  Agent Approved for prevention Approved for treatment BMD increased spine/hip Fracture reduction  Estrogen/Hormone Therapy (Estrace, Estratab, Ogen, Premarin, Vivell, Prempro, Femhert, Orthoest) Yes Yes 3-6% 35% spine and hip  Bisphosphonates  (Fosamax, Actonel, Boniva) Yes Yes 3-8% 35-50% spine and non-spine  Calcitonin (Miacalcin, Calcimar, Fortical) No Yes 0-3% None stated  Raloxifene (Evista) Yes Yes 2-3% 30-55%  Parathyroid Hormone (Forteo) No Yes, only in those at high risk for fracture None stated 53-65%     Recommended Daily Calcium Intakes   Population Group NIH/NOF* (mg elemental calcium)  Children 1-10 years 800-1200  Children 11-24 years 1200-1500  Men and Women 25-64 years At least 1200  Pregnant/Lactating At least 1200  Postmenopausal women with hormone replacement therapy At least 1200  Postmenopausal women without     hormone replacement therapy At least 1200  Men and women 65 + At least 1200  *In 1987, 1990, 1994, and 2000,  the NIH held consensus conferences on osteoporosis and calcium.  This column shows the most recent recommendations regarding calcium intak for preventing and managing osteoporosis.          Calcium Content of Selected Foods  Dairy Foods Calcium Content (mg) Non-Dairy Foods Calcium Content (mg)  Buttermilk, 1 cup 300 Calcium-fortified juice, 1 cup 300  Milk, 1 cup 300 Salmon, canned with Bones, 2 oz 100  Lactaid milk, 1 cup 300-500 Oysters, raw 13-19 medium 226  Soy milk, 1 cup 200-300 Sardines, canned with bones, 3 oz 372  Yogurt (plain, lowfat) 1 cup 250-300 Shrimp, canned 3 oz 98  Frozen yogurt (fruit) 1 cup 200-600 Collard greens, cooked 1 cup 357  Cheddar, mozzarella, or [place] cheese, 1oz 205 Broccoli, cooked 1 cup 78  Cottage cheese (lowfat) 4 oz 200 Soybeans, cooked 1 cup 131  Part-skim ricotta cheese, 4oz 335 Tofu, 4oz* *  Vanilla ice cream, 1 cup 120-300    *Calcium content of tofu varies depending on processing method; check nutritional label on package for precise calcium content.     Suggested Guidelines for Calcium Supplement Use:  ? Calcium is absorbed most efficiently if taken in small amounts throughout the day.  Always divide the daily dose into smaller amounts if the total daily dose is 500mg or more per day.  The body cannot use more than 500mg Calcium at any one time. ? The use of manufactured supplements is encouraged.  Calcium as bone meal or dolomite may contain lead or other heavy metals as contaminants. ? Calcium supplements should not be taken with high fiber meals or with bulk forming laxatives. ? If calcium carbonate is used as the supplement form, it should be taken with meals to assure that stomach acid production is present to facilitate optimal dissolution and absorption of calcium.  This is important if atrophic gastritis with hypo- or achlorhydria is present, which it is in 20-50% of older individuals. ? It is important to drink plenty  of fluids while using the supplement to help reduce problems with side effects like constipation or bloating.  If these symptoms become a problem, switching to another form of supplement may be the answer. ? Another alternative is calcium-fortified foods, including fruit juices, cereals, and breads.  These foods are now marketed with added calcium and may be less likely to cause side effects. ? Those with personal or family histories of kidney stones should be monitored to assure that hypercalcuria does not occur. CALCIUM INTAKE QUIZ  Dairy products are the primary source of calcium for most people.  For a quick estimate of your daily calcium intake, complete the following steps:  1. Use the chart below to determine your daily intake of calcium from diary foods. Servings of dairy per day 1 2 3 4 5 6 7 8  Milligrams (mg) of calcium: 250 500 750 1000 1250 1500 1750 2000   2.  Enter your total daily calcium intake from dairy foods:     _____mg  3.  Add 350 mg, which is the average for all other dietary sources:                 +            350 mg  4.  The sum of your total daily calcium intake:                 ______mg  5.  Enter the recommended calcium intake for your age from the chart below;         ______mg  6.  Enter your daily intake from step 4 above and subtract:                             -        _______mg  7.  The result is how much additional calcium you need:                                          ______mg      Recommended Daily Calcium Intake  Population Calcium (mg)  Children 1-10 years 800-1200  Children 11-24 years 1200-1500  Men and women 25-64 At least 1200  Pregnant/Lactating At least 1200  Postmenopausal women with hormone replacement therapy At least 1200  Postmenopausal women without hormone replacement therapy At least 1200  Men and women 65+ At least 1200       SAFETY TIPS FOR FALL PREVENTION   1. Remove throw rugs and make certain carpet edges are  securely fastened to the floor.  2. Reduce clutter, especially in traffic areas of the home.   3. Install/maintain sturdy handrails at stairs.  4. Increase wattage of lighting in hallways, bathrooms, kitchens, stairwells, and entrances to home.  5. Use night-lights near bed, in hallways, and in bathroom to improve night safety.  6. Install safety handrails in shower, tub, and around toilet.  Bathtubs and shower stalls should have non-skid surfaces.  7. When you must reach for something high, use a safety step stool, one with wide steps and a friction surface to stand on.  A type equipped with a high handrail is preferred.  8. If a cane or other walking aid has been recommended, use it to help increase your stability.  9. Wear supportive, cushioned, low-heeled shoes.  Avoid "scuffs" (backless bedroom slippers) and high heels.  10. Avoid rushing to answer a phone, doorbell, or anything else!  A portable phone that you can take from room to room with you is a good idea for security and safety.  11. Exercise regularly and stay active!!    Resources  National Osteoporosis Foundation www.NOF.org   Exercise for Osteoporosis; A Safe and Effective Way to Build Bone Density and Muscle Strength By: Dianne Daniels, M.A.                                             DO's and DON'T's   Avoid and/or Minimize positions of forward bending ( flexion)  Side bending and rotation of the trunk  Especially when movements occur together   When your back aches:   Don't sit down   Lie down on your back with a small pillow under your head and one under your knees or as outlined by our therapist. Or, lie in the 90/90 position ( on the floor with your feet and legs on the sofa with knees and hips bent to 90 degrees)  Tying or putting on your shoes:   Don't bend over to tie your shoes or put on socks.  Instead, bring one foot up, cross it over the opposite knee and bend   forward (hinge) at the  hips to so the task.  Keep your back straight.  If you cannot do this safely, then you need to use long handled assistive devices such as a shoehorn and sock puller.  Exercising:  Don't engage in ballistic types of exercise routines such as high-impact aerobics or jumping rope  Don't do exercises in the gym that bring you forward (abdominal crunches, sit-ups, touching your  toes, knee-to-chest, straight leg raising.)  Follow a regular exercise program that includes a variety of different weight-bearing activities, such as low-impact aerobics, T' ai chi or walking as your physical therapist advises  Do exercises that emphasize return to normal body alignment and strengthening of the muscles that keep your back straight, as outlined in this program or by your therapist  Household tasks:  Don't reach unnecessarily or twist your trunk when mopping, sweeping, vacuuming, raking, making beds, weeding gardens, getting objects ou of cupboards, etc.  Keep your broom, mop, vacuum, or rake close to you and mover your whole body as you move them. Walk over to the area on which you are working. Arrange kitchen, bathroom, and bedroom shelves so that frequently used items may be reached without excessive bending, twisting, and reaching.  Use a sturdy stool if necessary.  Don't bend from the waist to pick up something up  Off the floor, out of the trunk of your car, or to brush your teeth, wash your face, etc.   Bend at the knees, keeping back straight as possible. Use a reacher if necessary.   Prevention of fracture is the so-called "BOTTOm -Line" in the management of OSTEOPOROSIS. Do not take unnecessary chances in movement. Once a compression fracture occurs, the process is very difficult to control; one fracture is frequently followed by many more.  [facility] [address], [address] [place], [place] [address] Phone # [phone] Fax [phone]    

## 2017-07-21 NOTE — Therapy (Addendum)
[facility] [address] [place], [place], [address] Phone: [phone]   Fax:  [phone]  Physical Therapy Treatment  Patient Details  Name: Nancy Le MRN: [id number] Date of Birth: June 28, 1956 Referring Provider: Dr. Jonathon Jordan  Encounter Date: 07/21/2017      PT End of Session - 07/21/17 0922    Visit Number 1   Date for PT Re-Evaluation 08/18/17   Authorization Type [employer]   PT Start Time 0850   PT Stop Time 0920   PT Time Calculation (min) 30 min   Activity Tolerance Patient tolerated treatment well   Behavior During Therapy [facility] for tasks assessed/performed      Past Medical History:  Diagnosis Date  . Degenerative disc disease, cervical   . GERD (gastroesophageal reflux disease)     Past Surgical History:  Procedure Laterality Date  . GALLBLADDER SURGERY  2009    There were no vitals filed for this visit.      Subjective Assessment - 07/21/17 0855    Subjective Patient had a bone density and had osteoporosis.  coming to therapy for weightbearing exercises. Right hip pain is getting worse in the past 3 years and unable to sit at 90 degrees.    Patient Stated Goals learn weightbearing exercises   Currently in Pain? Yes   Pain Score 4    Pain Location Hip   Pain Orientation Right   Pain Descriptors / Indicators Sore   Pain Type Chronic pain   Pain Onset More than a month ago   Pain Frequency Intermittent   Aggravating Factors  when right hip is at 90 degrees    Pain Relieving Factors sit with hip below 90 degrees   Multiple Pain Sites No            [facility] PT Assessment - 07/21/17 0001      Assessment   Medical Diagnosis M81.0 Osteoporosis   Referring Provider Dr. Jonathon Jordan   Onset Date/Surgical Date 07/14/17   Prior Therapy None     Precautions   Precautions Other (comment)   Precaution Comments osteoporosis     Restrictions   Weight Bearing Restrictions No     Balance Screen   Has the patient fallen in the past 6 months No   Has the patient had a decrease in activity level because of a fear of falling?  No   Is the patient reluctant to leave their home because of a fear of falling?  No     Home [employer] residence     Prior Function   Level of Independence Independent   Vocation Requirements sitting all day     Cognition   Overall Cognitive Status Within Functional Limits for tasks assessed     Observation/Other Assessments   Focus on Therapeutic Outcomes (FOTO)  therapist discrection due to osteo and right hip pain is 20% limitation     Posture/Postural Control   Posture/Postural Control No significant limitations     ROM / Strength   AROM / PROM / Strength AROM;PROM;Strength     AROM   Overall AROM Comments lumbar right sidebending decreased by 25%     Strength   Overall Strength Comments bil. knee strength is 5/5   Strength Assessment Site Hip   Right Hip ABduction 4/5   Left Hip ABduction 4/5     Palpation   Palpation comment pelvis in correct alignment     Standardized Balance  Assessment   Standardized Balance Assessment Five Times Sit to Stand  single leg stance 10 sec.; tandem stance 30 sec bil.    Five times sit to stand comments  10 sec                             PT Education - 07/21/17 0921    Education provided Yes   Education Details information on dry needling   Person(s) Educated Patient   Methods Explanation   Comprehension Verbalized understanding             PT Long Term Goals - 07/21/17 0927      PT LONG TERM GOAL #1   Title independent with HEP for weightbearing exericses with correct posture   Time 4   Period Weeks   Status New   Target Date 08/18/17     PT LONG TERM GOAL #2   Title understand correct body mechanics to protect her spine   Time 4   Period Weeks   Status New   Target Date 08/18/17     PT LONG TERM GOAL #3   Title  understand ways to prevent falls   Time 4   Period Weeks   Status New   Target Date 08/18/17               Plan - 07/21/17 0973    Clinical Impression Statement Patient is a 61 year old female with diagnosis of Osteoporosis. Patient reports right hip pain at level 5/10 when she sits to long and has it at 90 degrees flexion.  Patient will sit with her hip elevated  and less than 90 degrees flexion.  Patient has weakness in bilateral hip abduction.  Patient has good posture and balance.  Patient is not educated on how to correctly exercise to improve bone density and would like to know.  Patient will benefit from education on correct body mechanics, how to exercise correctly for weightbearing activities and reduce risk of falls.    History and Personal Factors relevant to plan of care: osteoporosis   Clinical Presentation Stable   Clinical Presentation due to: stable condition   Clinical Decision Making Low   Rehab Potential Excellent   Clinical Impairments Affecting Rehab Potential osteoporosis   PT Frequency 1x / week   PT Duration 4 weeks   PT Treatment/Interventions Therapeutic activities;Therapeutic exercise;Neuromuscular re-education;Patient/family education   PT Next Visit Plan weightbearing activity and exercises to improve postural muscles and improve bone mass; education on correct body mechanics    PT Home Exercise Plan look above   Consulted and Agree with Plan of Care Patient      Patient will benefit from skilled therapeutic intervention in order to improve the following deficits and impairments:  Decreased strength  Visit Diagnosis: Muscle weakness (generalized) - Plan: PT plan of care cert/re-cert     Problem List There are no active problems to display for this patient.   Earlie Counts, PT 07/21/17 9:30 AM   [facility] [address], [place], [place], [address] Phone: [phone]   Fax:   [phone]  Name: Nancy Le MRN: [id number] Date of Birth: 09/01/1956  PHYSICAL THERAPY DISCHARGE SUMMARY  Visits from Start of Care: 1  Current functional level related to goals / functional outcomes: See above.    Remaining deficits: See above. Unable to assess patient for discharge due to not returning after initial  evaluation.    Education / Equipment: HEP Plan:                                                    Patient goals were not met. Patient is being discharged due to not returning since the last visit.  Thank you for the referral. Earlie Counts, PT 12/28/17 3:07 PM  ?????

## 2017-08-02 ENCOUNTER — Ambulatory Visit: Payer: 59 | Admitting: Physical Therapy

## 2017-08-09 ENCOUNTER — Encounter: Payer: 59 | Admitting: Physical Therapy

## 2017-09-20 ENCOUNTER — Other Ambulatory Visit: Payer: Self-pay | Admitting: Orthopedic Surgery

## 2017-09-20 DIAGNOSIS — M25551 Pain in right hip: Secondary | ICD-10-CM

## 2017-10-05 ENCOUNTER — Ambulatory Visit
Admission: RE | Admit: 2017-10-05 | Discharge: 2017-10-05 | Disposition: A | Discharge status: Home / Self Care | Payer: 59 | Source: Ambulatory Visit | Attending: Orthopedic Surgery | Admitting: Orthopedic Surgery

## 2017-10-05 DIAGNOSIS — M25551 Pain in right hip: Secondary | ICD-10-CM

## 2017-10-05 MED ORDER — IOPAMIDOL (ISOVUE-M 200) INJECTION 41%
15.0000 mL | Freq: Once | INTRAMUSCULAR | Status: AC
  Administered 2017-10-05: 15 mL via INTRA_ARTICULAR

## 2017-10-23 DIAGNOSIS — S73191A Other sprain of right hip, initial encounter: Secondary | ICD-10-CM | POA: Insufficient documentation

## 2018-02-27 DIAGNOSIS — M76891 Other specified enthesopathies of right lower limb, excluding foot: Secondary | ICD-10-CM | POA: Insufficient documentation

## 2018-04-13 ENCOUNTER — Other Ambulatory Visit: Payer: Self-pay | Admitting: Family Medicine

## 2018-04-13 DIAGNOSIS — Z1231 Encounter for screening mammogram for malignant neoplasm of breast: Secondary | ICD-10-CM

## 2018-05-28 ENCOUNTER — Ambulatory Visit
Admission: RE | Admit: 2018-05-28 | Discharge: 2018-05-28 | Disposition: A | Discharge status: Home / Self Care | Payer: 59 | Source: Ambulatory Visit | Attending: Family Medicine | Admitting: Family Medicine

## 2018-05-28 DIAGNOSIS — Z1231 Encounter for screening mammogram for malignant neoplasm of breast: Secondary | ICD-10-CM

## 2018-11-21 ENCOUNTER — Other Ambulatory Visit: Payer: Self-pay | Admitting: Family Medicine

## 2018-11-21 ENCOUNTER — Other Ambulatory Visit (HOSPITAL_COMMUNITY)
Admission: RE | Admit: 2018-11-21 | Discharge: 2018-11-21 | Disposition: A | Discharge status: Home / Self Care | Payer: 59 | Source: Ambulatory Visit | Attending: Family Medicine | Admitting: Family Medicine

## 2018-11-21 DIAGNOSIS — Z01411 Encounter for gynecological examination (general) (routine) with abnormal findings: Secondary | ICD-10-CM | POA: Insufficient documentation

## 2018-11-26 LAB — CYTOLOGY - PAP: Diagnosis: NEGATIVE

## 2019-05-14 ENCOUNTER — Other Ambulatory Visit: Payer: Self-pay | Admitting: Family Medicine

## 2019-05-14 DIAGNOSIS — Z1231 Encounter for screening mammogram for malignant neoplasm of breast: Secondary | ICD-10-CM

## 2019-07-02 ENCOUNTER — Other Ambulatory Visit: Payer: Self-pay

## 2019-07-02 ENCOUNTER — Ambulatory Visit
Admission: RE | Admit: 2019-07-02 | Discharge: 2019-07-02 | Disposition: A | Discharge status: Home / Self Care | Payer: 59 | Source: Ambulatory Visit | Attending: Family Medicine | Admitting: Family Medicine

## 2019-07-02 DIAGNOSIS — Z1231 Encounter for screening mammogram for malignant neoplasm of breast: Secondary | ICD-10-CM

## 2019-09-16 ENCOUNTER — Other Ambulatory Visit: Payer: Self-pay

## 2019-09-16 DIAGNOSIS — Z20822 Contact with and (suspected) exposure to covid-19: Secondary | ICD-10-CM

## 2019-09-17 LAB — NOVEL CORONAVIRUS, NAA: SARS-CoV-2, NAA: NOT DETECTED

## 2020-05-27 ENCOUNTER — Other Ambulatory Visit: Payer: Self-pay | Admitting: Family Medicine

## 2020-05-27 DIAGNOSIS — Z1231 Encounter for screening mammogram for malignant neoplasm of breast: Secondary | ICD-10-CM

## 2020-07-09 ENCOUNTER — Ambulatory Visit
Admission: RE | Admit: 2020-07-09 | Discharge: 2020-07-09 | Disposition: A | Discharge status: Home / Self Care | Payer: 59 | Source: Ambulatory Visit | Attending: Family Medicine | Admitting: Family Medicine

## 2020-07-09 ENCOUNTER — Other Ambulatory Visit: Payer: Self-pay

## 2020-07-09 DIAGNOSIS — Z1231 Encounter for screening mammogram for malignant neoplasm of breast: Secondary | ICD-10-CM

## 2020-07-27 ENCOUNTER — Other Ambulatory Visit: Payer: Self-pay | Admitting: Family Medicine

## 2020-07-27 DIAGNOSIS — E2839 Other primary ovarian failure: Secondary | ICD-10-CM

## 2020-10-07 ENCOUNTER — Other Ambulatory Visit: Payer: Self-pay

## 2020-10-07 ENCOUNTER — Ambulatory Visit
Admission: RE | Admit: 2020-10-07 | Discharge: 2020-10-07 | Disposition: A | Discharge status: Home / Self Care | Payer: 59 | Source: Ambulatory Visit | Attending: Family Medicine | Admitting: Family Medicine

## 2020-10-07 DIAGNOSIS — E2839 Other primary ovarian failure: Secondary | ICD-10-CM

## 2021-01-18 ENCOUNTER — Other Ambulatory Visit: Payer: Self-pay | Admitting: Family Medicine

## 2021-01-18 ENCOUNTER — Ambulatory Visit
Admission: RE | Admit: 2021-01-18 | Discharge: 2021-01-18 | Disposition: A | Discharge status: Home / Self Care | Payer: 59 | Source: Ambulatory Visit | Attending: Family Medicine | Admitting: Family Medicine

## 2021-01-18 DIAGNOSIS — M79672 Pain in left foot: Secondary | ICD-10-CM

## 2021-01-28 ENCOUNTER — Other Ambulatory Visit: Payer: Self-pay

## 2021-01-28 ENCOUNTER — Ambulatory Visit (INDEPENDENT_AMBULATORY_CARE_PROVIDER_SITE_OTHER): Payer: 59

## 2021-01-28 ENCOUNTER — Ambulatory Visit (INDEPENDENT_AMBULATORY_CARE_PROVIDER_SITE_OTHER): Payer: 59 | Admitting: Podiatry

## 2021-01-28 DIAGNOSIS — F332 Major depressive disorder, recurrent severe without psychotic features: Secondary | ICD-10-CM | POA: Insufficient documentation

## 2021-01-28 DIAGNOSIS — M779 Enthesopathy, unspecified: Secondary | ICD-10-CM

## 2021-01-28 DIAGNOSIS — K648 Other hemorrhoids: Secondary | ICD-10-CM | POA: Insufficient documentation

## 2021-01-28 DIAGNOSIS — M79672 Pain in left foot: Secondary | ICD-10-CM | POA: Diagnosis not present

## 2021-01-28 DIAGNOSIS — M810 Age-related osteoporosis without current pathological fracture: Secondary | ICD-10-CM | POA: Insufficient documentation

## 2021-01-28 DIAGNOSIS — K589 Irritable bowel syndrome without diarrhea: Secondary | ICD-10-CM | POA: Insufficient documentation

## 2021-01-28 DIAGNOSIS — E038 Other specified hypothyroidism: Secondary | ICD-10-CM | POA: Insufficient documentation

## 2021-01-28 DIAGNOSIS — G8929 Other chronic pain: Secondary | ICD-10-CM | POA: Insufficient documentation

## 2021-01-28 DIAGNOSIS — K922 Gastrointestinal hemorrhage, unspecified: Secondary | ICD-10-CM | POA: Insufficient documentation

## 2021-01-28 DIAGNOSIS — S9030XA Contusion of unspecified foot, initial encounter: Secondary | ICD-10-CM

## 2021-01-28 DIAGNOSIS — G4700 Insomnia, unspecified: Secondary | ICD-10-CM | POA: Insufficient documentation

## 2021-01-28 DIAGNOSIS — R197 Diarrhea, unspecified: Secondary | ICD-10-CM | POA: Insufficient documentation

## 2021-01-28 DIAGNOSIS — R194 Change in bowel habit: Secondary | ICD-10-CM | POA: Insufficient documentation

## 2021-01-28 DIAGNOSIS — M8580 Other specified disorders of bone density and structure, unspecified site: Secondary | ICD-10-CM | POA: Insufficient documentation

## 2021-01-28 DIAGNOSIS — F32A Depression, unspecified: Secondary | ICD-10-CM | POA: Insufficient documentation

## 2021-01-28 DIAGNOSIS — E2839 Other primary ovarian failure: Secondary | ICD-10-CM | POA: Insufficient documentation

## 2021-01-28 DIAGNOSIS — E611 Iron deficiency: Secondary | ICD-10-CM | POA: Insufficient documentation

## 2021-01-28 DIAGNOSIS — M5030 Other cervical disc degeneration, unspecified cervical region: Secondary | ICD-10-CM | POA: Insufficient documentation

## 2021-01-28 DIAGNOSIS — K219 Gastro-esophageal reflux disease without esophagitis: Secondary | ICD-10-CM | POA: Insufficient documentation

## 2021-01-28 DIAGNOSIS — S86012A Strain of left Achilles tendon, initial encounter: Secondary | ICD-10-CM | POA: Insufficient documentation

## 2021-01-28 NOTE — Patient Instructions (Signed)

## 2021-02-01 NOTE — Progress Notes (Signed)
Subjective:   Patient ID: Nancy Le, female   DOB: 65 y.o.   MRN: [id number]   HPI 65 year old female presents the office today for concerns of left foot pain.  She said that she went on a hike on January 22 when the pain started.  She states that at that time there was snow and ice and she was walking differently.  After that she has had some aching sensation most of the bottom of her foot as well some swelling patient rested her foot and she was seen by her primary care physician anti-inflammatories were given.  Seems that the pain resolved but she wants to have the area checked.  Not having any pain currently.  Currently no swelling.  No other concerns.   Review of Systems  All other systems reviewed and are negative.  Past Medical History:  Diagnosis Date  . Degenerative disc disease, cervical   . GERD (gastroesophageal reflux disease)     Past Surgical History:  Procedure Laterality Date  . GALLBLADDER SURGERY  2009     Current Outpatient Medications:  .  alendronate (FOSAMAX) 70 MG tablet, 1 tablet 30 minutes before the first food, beverage or medicine of the day with plain water, Disp: , Rfl:  .  meloxicam (MOBIC) 15 MG tablet, 1 tablet, Disp: , Rfl:  .  buPROPion (WELLBUTRIN XL) 150 MG 24 hr tablet, Take 300 mg by mouth daily. , Disp: , Rfl:  .  ciprofloxacin (CIPRO) 500 MG tablet, Take 1 tablet (500 mg total) by mouth 2 (two) times daily. (Patient not taking: Reported on 07/21/2017), Disp: 14 tablet, Rfl: 0 .  cyclobenzaprine (FLEXERIL) 10 MG tablet, cyclobenzaprine 10 mg tablet, Disp: , Rfl:  .  diphenhydrAMINE (SOMINEX) 25 MG tablet, Take 25 mg by mouth at bedtime., Disp: , Rfl:  .  gabapentin (NEURONTIN) 300 MG capsule, Take 300 mg by mouth 2 (two) times daily., Disp: , Rfl:  .  Melatonin 10 MG TBCR, Take 10 mg by mouth at bedtime., Disp: , Rfl:  .  metroNIDAZOLE (FLAGYL) 500 MG tablet, Take 1 tablet (500 mg total) by mouth 2 (two) times daily. (Patient not taking:  Reported on 07/21/2017), Disp: 14 tablet, Rfl: 0 .  Multiple Vitamin (MULTIVITAMIN WITH MINERALS) TABS tablet, Take 1 tablet by mouth daily., Disp: , Rfl:  .  nitroGLYCERIN (NITRODUR - DOSED IN MG/24 HR) 0.2 mg/hr patch, Place 1 patch onto the skin daily., Disp: , Rfl:  .  omeprazole (PRILOSEC) 40 MG capsule, Take 40 mg by mouth daily., Disp: , Rfl:  .  ondansetron (ZOFRAN-ODT) 8 MG disintegrating tablet, Take 1 tablet (8 mg total) by mouth every 8 (eight) hours as needed for nausea or vomiting. (Patient not taking: Reported on 07/21/2017), Disp: 12 tablet, Rfl: 0 .  oxyCODONE-acetaminophen (PERCOCET/ROXICET) 5-325 MG per tablet, Take 1 tablet by mouth every 4 (four) hours as needed for severe pain. (Patient not taking: Reported on 07/21/2017), Disp: 15 tablet, Rfl: 0 .  zolpidem (AMBIEN) 5 MG tablet, Take 2.5 mg by mouth at bedtime., Disp: , Rfl:   Allergies  Allergen Reactions  . Sulfa Antibiotics Swelling and Other (See Comments)    Joint Swelling  . Sulfasalazine Other (See Comments) and Swelling    Joint Swelling  . Codeine Nausea Only        Objective:  Physical Exam  General: AAO x3, NAD  Dermatological: Skin is warm, dry and supple bilateral. There are no open sores, no preulcerative lesions, no rash  or signs of infection present.  Vascular: Dorsalis Pedis artery and Posterior Tibial artery pedal pulses are 2/4 bilateral with immedate capillary fill time.  There is no pain with calf compression, swelling, warmth, erythema.   Neruologic: Grossly intact via light touch bilateral. Negative tinel sign.   Musculoskeletal: Unable to elicit any area of pinpoint tenderness but there is no edema, erythema.  Subjectively there is discomfort on the dorsal medial aspect the foot on Lisfranc joint area but not able to identify any tenderness today.  Muscular strength 5/5 in all groups tested bilateral.  Gait: Unassisted, Nonantalgic.       Assessment:   Resolved capsulitis/tendinitis      Plan:  -Treatment options discussed including all alternatives, risks, and complications -Etiology of symptoms were discussed -X-rays were obtained and reviewed with the patient.  There is no evidence of acute fracture or stress fracture identified today. -Pes planus in her symptoms have resolved.  Discussed with her she can start back hiking now and take it slow and gradually increase her activity level.  If it is recurrent let me know.  Otherwise I will see her back as needed.  Vivi Barrack DPM

## 2021-06-04 ENCOUNTER — Other Ambulatory Visit: Payer: Self-pay | Admitting: Family Medicine

## 2021-06-04 DIAGNOSIS — Z1231 Encounter for screening mammogram for malignant neoplasm of breast: Secondary | ICD-10-CM

## 2021-06-08 ENCOUNTER — Other Ambulatory Visit: Payer: Self-pay | Admitting: Family Medicine

## 2021-06-10 ENCOUNTER — Other Ambulatory Visit: Payer: Self-pay | Admitting: Family Medicine

## 2021-06-10 DIAGNOSIS — N644 Mastodynia: Secondary | ICD-10-CM

## 2021-07-16 ENCOUNTER — Ambulatory Visit
Admission: RE | Admit: 2021-07-16 | Discharge: 2021-07-16 | Disposition: A | Discharge status: Home / Self Care | Payer: No Typology Code available for payment source | Source: Ambulatory Visit | Attending: Family Medicine | Admitting: Family Medicine

## 2021-07-16 ENCOUNTER — Other Ambulatory Visit: Payer: Self-pay

## 2021-07-16 ENCOUNTER — Ambulatory Visit: Payer: 59

## 2021-07-16 DIAGNOSIS — N644 Mastodynia: Secondary | ICD-10-CM

## 2021-08-11 ENCOUNTER — Other Ambulatory Visit (HOSPITAL_COMMUNITY)
Admission: RE | Admit: 2021-08-11 | Discharge: 2021-08-11 | Disposition: A | Discharge status: Home / Self Care | Payer: No Typology Code available for payment source | Source: Ambulatory Visit | Attending: Family Medicine | Admitting: Family Medicine

## 2021-08-11 DIAGNOSIS — Z01411 Encounter for gynecological examination (general) (routine) with abnormal findings: Secondary | ICD-10-CM | POA: Diagnosis present

## 2021-08-18 LAB — CYTOLOGY - PAP
Comment: NEGATIVE
Diagnosis: NEGATIVE
High risk HPV: NEGATIVE

## 2022-08-10 ENCOUNTER — Other Ambulatory Visit: Payer: Self-pay | Admitting: Family Medicine

## 2022-08-10 DIAGNOSIS — Z1231 Encounter for screening mammogram for malignant neoplasm of breast: Secondary | ICD-10-CM

## 2022-08-11 ENCOUNTER — Other Ambulatory Visit: Payer: Self-pay | Admitting: Family Medicine

## 2022-08-11 DIAGNOSIS — M810 Age-related osteoporosis without current pathological fracture: Secondary | ICD-10-CM

## 2022-08-17 ENCOUNTER — Ambulatory Visit
Admission: RE | Admit: 2022-08-17 | Discharge: 2022-08-17 | Disposition: A | Discharge status: Home / Self Care | Payer: Medicare Other | Source: Ambulatory Visit | Attending: Family Medicine | Admitting: Family Medicine

## 2022-08-17 DIAGNOSIS — Z1231 Encounter for screening mammogram for malignant neoplasm of breast: Secondary | ICD-10-CM

## 2023-01-19 ENCOUNTER — Ambulatory Visit
Admission: RE | Admit: 2023-01-19 | Discharge: 2023-01-19 | Disposition: A | Discharge status: Home / Self Care | Payer: Medicare Other | Source: Ambulatory Visit | Attending: Family Medicine | Admitting: Family Medicine

## 2023-01-19 DIAGNOSIS — M810 Age-related osteoporosis without current pathological fracture: Secondary | ICD-10-CM

## 2023-07-17 ENCOUNTER — Other Ambulatory Visit: Payer: Self-pay | Admitting: Family Medicine

## 2023-07-17 DIAGNOSIS — Z1231 Encounter for screening mammogram for malignant neoplasm of breast: Secondary | ICD-10-CM

## 2023-08-22 ENCOUNTER — Ambulatory Visit
Admission: RE | Admit: 2023-08-22 | Discharge: 2023-08-22 | Disposition: A | Discharge status: Home / Self Care | Payer: Medicare Other | Source: Ambulatory Visit | Attending: Family Medicine | Admitting: Family Medicine

## 2023-08-22 DIAGNOSIS — Z1231 Encounter for screening mammogram for malignant neoplasm of breast: Secondary | ICD-10-CM

## 2024-07-08 ENCOUNTER — Other Ambulatory Visit: Payer: Self-pay | Admitting: Family Medicine

## 2024-07-08 DIAGNOSIS — Z1231 Encounter for screening mammogram for malignant neoplasm of breast: Secondary | ICD-10-CM

## 2024-08-23 ENCOUNTER — Ambulatory Visit
Admission: RE | Admit: 2024-08-23 | Discharge: 2024-08-23 | Disposition: A | Discharge status: Home / Self Care | Source: Ambulatory Visit | Attending: Family Medicine | Admitting: Family Medicine

## 2024-08-23 DIAGNOSIS — Z1231 Encounter for screening mammogram for malignant neoplasm of breast: Secondary | ICD-10-CM

## 2024-08-28 ENCOUNTER — Other Ambulatory Visit: Payer: Self-pay | Admitting: Family Medicine

## 2024-08-28 DIAGNOSIS — R928 Other abnormal and inconclusive findings on diagnostic imaging of breast: Secondary | ICD-10-CM

## 2024-08-31 ENCOUNTER — Ambulatory Visit
Admission: RE | Admit: 2024-08-31 | Discharge: 2024-08-31 | Disposition: A | Discharge status: Home / Self Care | Source: Ambulatory Visit | Attending: Family Medicine | Admitting: Family Medicine

## 2024-08-31 ENCOUNTER — Ambulatory Visit

## 2024-08-31 DIAGNOSIS — R928 Other abnormal and inconclusive findings on diagnostic imaging of breast: Secondary | ICD-10-CM
